# Patient Record
Sex: Male | Born: 1975 | Hispanic: Yes | Marital: Single | Smoking: Never smoker
Health system: Southern US, Community
[De-identification: ages and names within clinical notes are randomized; demographics above are authoritative.]

## PROBLEM LIST (undated history)

## (undated) HISTORY — PX: NASAL FRACTURE SURGERY: SHX718

---

## 2015-12-24 ENCOUNTER — Emergency Department (HOSPITAL_COMMUNITY)
Admission: EM | Admit: 2015-12-24 | Discharge: 2015-12-24 | Disposition: A | Discharge status: Home / Self Care | Payer: Self-pay | Attending: Emergency Medicine | Admitting: Emergency Medicine

## 2015-12-24 ENCOUNTER — Encounter (HOSPITAL_COMMUNITY): Payer: Self-pay

## 2015-12-24 DIAGNOSIS — L299 Pruritus, unspecified: Secondary | ICD-10-CM | POA: Insufficient documentation

## 2015-12-24 MED ORDER — HYDROXYZINE HCL 25 MG PO TABS: 25.0000 mg | ORAL_TABLET | Freq: Four times a day (QID) | ORAL | 0 refills | Status: AC | PRN

## 2015-12-24 MED ORDER — PREDNISONE 10 MG (21) PO TBPK: 10.0000 mg | ORAL_TABLET | Freq: Every day | ORAL | 0 refills | Status: AC

## 2015-12-24 NOTE — Discharge Instructions (Signed)
You have been seen today for itching on the skin. Take the prednisone until finished. Also begin using a lotion such as Eucerin to avoid and treat dry skin. Use the hydroxyzine as needed at night to help with itching. Use caution with the hydroxyzine as this medication can make you sleepy. It should not be used while driving or performing other dangerous activities. Follow up with a primary care provider should symptoms continue.  Spencer Bauer has sido visto por picazn en la piel. Tome la prednisona hasta que termine. Tambin comience a usar Nurse, children'suna locin como Eucerin para Automotive engineerevitar y Tax inspectortratar la piel seca. Use la hidroxicina segn sea necesario por la noche para ayudar con la picazn. Tenga precaucin con la hidroxicina ya que este medicamento puede causarle sueo. No debe usarse mientras conduce o realiza otras actividades peligrosas. Haga un seguimiento con un proveedor de atencin primaria si los sntomas continan.

## 2015-12-24 NOTE — ED Triage Notes (Signed)
Patient c/o rash and itching torso and legs x 3 weeks. Patient states he brought cream and oral medication yesterday with no relief. Patient does not know the name of the medicines.

## 2015-12-24 NOTE — ED Provider Notes (Signed)
[facility]-EMERGENCY DEPT Provider Note   CSN: [id number] Arrival date & time: 12/24/15  1519   By signing my name below, I, Bobbie StackChristopher Reid, attest that this documentation has been prepared under the direction and in the presence of Cyd Hostler, PA-C. Electronically Signed: Bobbie Stackhristopher Reid, Scribe. 12/24/15. 4:06 PM. History   Chief Complaint Chief Complaint  Patient presents with  . Rash  . Pruritis     The history is provided by the patient. A language interpreter was used ([place]).   HPI Comments: Spencer Bauer is a 40 y.o. male who presents to the Emergency Department complaining of an intermittent, unchanging, pruritic rash on his torso, extremities, and groin that started 3 weeks ago.  He states the itchiness lasts for a few minutes at a time. He denies change in soaps, detergents, or body care products. He denies extensive outdoors exposure or change in work conditions. He denies sick contacts with similar symptoms. He reports no known allergies. 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 He denies cough or fever.   History reviewed. No pertinent past medical history.  There are no active problems to display for this patient.   Past Surgical History:  Procedure Laterality Date  . NASAL FRACTURE SURGERY         Home Medications    Prior to Admission medications   Medication Sig Start Date End Date Taking? Authorizing Provider  hydrOXYzine (ATARAX/VISTARIL) 25 MG tablet Take 1 tablet (25 mg total) by mouth every 6 (six) hours as needed for itching. 12/24/15   Torina Ey C Damir Leung, PA-C  predniSONE (STERAPRED UNI-PAK 21 TAB) 10 MG (21) TBPK tablet Take 1 tablet (10 mg total) by mouth daily. Take 6 tabs by mouth daily  for 2 days, then 5 tabs for 2 days, then 4 tabs for 2 days, then 3 tabs for 2 days, 2 tabs for 2 days, then 1 tab by mouth daily for 2 days 12/24/15   Anselm PancoastShawn C Brittain Smithey, PA-C    Family History History reviewed. No pertinent family history.  Social History Social  History  Substance Use Topics  . Smoking status: Never Smoker  . Smokeless tobacco: Never Used  . Alcohol use Yes     Comment: occasionally     Allergies   Patient has no known allergies.   Review of Systems Review of Systems  Constitutional: Negative for fever.  Respiratory: Negative for cough.   Skin: Positive for rash. Negative for wound.     Physical Exam Updated Vital Signs BP 116/79 (BP Location: Left Arm)   Pulse 69   Temp 98.3 F (36.8 C) (Oral)   Resp 18   Ht 5\' 6"  (1.676 m)   Wt 164 lb 4.8 oz (74.5 kg)   SpO2 99%   BMI 26.52 kg/m   Physical Exam  Constitutional: He appears well-developed and well-nourished. No distress.  HENT:  Head: Normocephalic and atraumatic.  Eyes: Conjunctivae are normal.  Neck: Neck supple.  Cardiovascular: Normal rate and regular rhythm.   Pulmonary/Chest: Effort normal.  Neurological: He is alert.  Skin: Skin is warm and dry. He is not diaphoretic. There is erythema.  Scattered erythema across back, chest, and arms with occasional flaky skin noted. No raised lesions or pustules.   Psychiatric: He has a normal mood and affect. His behavior is normal.  Nursing note and vitals reviewed.    ED Treatments / Results  DIAGNOSTIC STUDIES: Oxygen Saturation is 99% on RA, adequate by my interpretation.    COORDINATION  OF CARE: 4:07 PM Discussed treatment plan with pt at bedside and pt agreed to plan.  Labs (all labs ordered are listed, but only abnormal results are displayed) Labs Reviewed - No data to display  EKG  EKG Interpretation None       Radiology No results found.  Procedures Procedures (including critical care time)  Medications Ordered in ED Medications - No data to display   Initial Impression / Assessment and Plan / ED Course  I have reviewed the triage vital signs and the nursing notes.  Pertinent labs & imaging results that were available during my care of the patient were reviewed by me and  considered in my medical decision making (see chart for details).  Clinical Course     Patient presents with intermittent pruritic rash over the last 3 weeks. Physical exam findings are consistent with xeroderma vs contact dermatitis. Recommended PCP follow-up.     Final Clinical Impressions(s) / ED Diagnoses   Final diagnoses:  Pruritus    New Prescriptions Discharge Medication List as of 12/24/2015  4:23 PM    START taking these medications   Details  hydrOXYzine (ATARAX/VISTARIL) 25 MG tablet Take 1 tablet (25 mg total) by mouth every 6 (six) hours as needed for itching., Starting Tue 12/24/2015, Print    predniSONE (STERAPRED UNI-PAK 21 TAB) 10 MG (21) TBPK tablet Take 1 tablet (10 mg total) by mouth daily. Take 6 tabs by mouth daily  for 2 days, then 5 tabs for 2 days, then 4 tabs for 2 days, then 3 tabs for 2 days, 2 tabs for 2 days, then 1 tab by mouth daily for 2 days, Starting Tue 12/24/2015, Print       I personally performed the services described in this documentation, which was scribed in my presence. The recorded information has been reviewed and is accurate.    Anselm PancoastShawn C Bird Swetz, PA-C 12/24/15 1941    Geoffery Lyonsouglas Delo, MD 12/25/15 [phone]

## 2016-10-06 ENCOUNTER — Emergency Department (HOSPITAL_COMMUNITY)
Admission: EM | Admit: 2016-10-06 | Discharge: 2016-10-06 | Disposition: A | Discharge status: Home / Self Care | Payer: Self-pay | Attending: Emergency Medicine | Admitting: Emergency Medicine

## 2016-10-06 ENCOUNTER — Encounter (HOSPITAL_COMMUNITY): Payer: Self-pay | Admitting: Emergency Medicine

## 2016-10-06 DIAGNOSIS — F1010 Alcohol abuse, uncomplicated: Secondary | ICD-10-CM

## 2016-10-06 DIAGNOSIS — F1020 Alcohol dependence, uncomplicated: Secondary | ICD-10-CM | POA: Insufficient documentation

## 2016-10-06 LAB — CBC WITH DIFFERENTIAL/PLATELET
Basophils Absolute: 0 10*3/uL (ref 0.0–0.1)
Basophils Relative: 0 %
EOS PCT: 1 %
Eosinophils Absolute: 0.1 10*3/uL (ref 0.0–0.7)
HEMATOCRIT: 42.2 % (ref 39.0–52.0)
Hemoglobin: 14.2 g/dL (ref 13.0–17.0)
LYMPHS ABS: 2.5 10*3/uL (ref 0.7–4.0)
Lymphocytes Relative: 36 %
MCH: 27.6 pg (ref 26.0–34.0)
MCHC: 33.6 g/dL (ref 30.0–36.0)
MCV: 82.1 fL (ref 78.0–100.0)
MONO ABS: 0.2 10*3/uL (ref 0.1–1.0)
Monocytes Relative: 3 %
Neutro Abs: 4.2 10*3/uL (ref 1.7–7.7)
Neutrophils Relative %: 60 %
PLATELETS: 225 10*3/uL (ref 150–400)
RBC: 5.14 MIL/uL (ref 4.22–5.81)
RDW: 14 % (ref 11.5–15.5)
WBC: 7 10*3/uL (ref 4.0–10.5)

## 2016-10-06 LAB — COMPREHENSIVE METABOLIC PANEL
ALBUMIN: 3.8 g/dL (ref 3.5–5.0)
ALK PHOS: 106 U/L (ref 38–126)
ALT: 19 U/L (ref 17–63)
AST: 24 U/L (ref 15–41)
Anion gap: 10 (ref 5–15)
BUN: 10 mg/dL (ref 6–20)
CALCIUM: 8.7 mg/dL — AB (ref 8.9–10.3)
CO2: 24 mmol/L (ref 22–32)
CREATININE: 0.67 mg/dL (ref 0.61–1.24)
Chloride: 104 mmol/L (ref 101–111)
GFR calc Af Amer: >60 mL/min (ref >60)
GFR calc non Af Amer: >60 mL/min (ref >60)
GLUCOSE: 128 mg/dL — AB (ref 65–99)
Potassium: 3.4 mmol/L — ABNORMAL LOW (ref 3.5–5.1)
SODIUM: 138 mmol/L (ref 135–145)
Total Bilirubin: 0.5 mg/dL (ref 0.3–1.2)
Total Protein: 6.6 g/dL (ref 6.5–8.1)

## 2016-10-06 LAB — ETHANOL: Alcohol, Ethyl (B): <10 mg/dL (ref <10)

## 2016-10-06 LAB — LIPASE, BLOOD: Lipase: 24 U/L (ref 11–51)

## 2016-10-06 MED ORDER — SODIUM CHLORIDE 0.9 % IV BOLUS (SEPSIS)
1000.0000 mL | Freq: Once | INTRAVENOUS | Status: AC
  Administered 2016-10-06: 1000 mL via INTRAVENOUS

## 2016-10-06 MED ORDER — ONDANSETRON HCL 4 MG/2ML IJ SOLN
4.0000 mg | Freq: Once | INTRAMUSCULAR | Status: AC
  Administered 2016-10-06: 4 mg via INTRAVENOUS
  Filled 2016-10-06: qty 2

## 2016-10-06 NOTE — ED Provider Notes (Signed)
[facility]-EMERGENCY DEPT Provider Note   CSN: [id number] Arrival date & time: 10/06/16  9562     History   Chief Complaint Chief Complaint  Patient presents with  . Alcohol Intoxication    HPI Spencer Bauer is a 41 y.o. male.  HPI Patient presents due to feeling poorly. Patient states that he was in his usual state of health until yesterday. After drinking some beer in substantialamounts 2 days prior and last night, he has been feeling poorly, with generalized discomfort, nausea, vomiting, weakness. No focal pain, no confusion, and fever. Patient notes that he had not had alcohol in several months, but previously drank regularly.     History reviewed. No pertinent past medical history.  There are no active problems to display for this patient.   Past Surgical History:  Procedure Laterality Date  . NASAL FRACTURE SURGERY         Home Medications    Prior to Admission medications   Medication Sig Start Date End Date Taking? Authorizing Provider  hydrOXYzine (ATARAX/VISTARIL) 25 MG tablet Take 1 tablet (25 mg total) by mouth every 6 (six) hours as needed for itching. 12/24/15   Joy, Shawn C, PA-C  predniSONE (STERAPRED UNI-PAK 21 TAB) 10 MG (21) TBPK tablet Take 1 tablet (10 mg total) by mouth daily. Take 6 tabs by mouth daily  for 2 days, then 5 tabs for 2 days, then 4 tabs for 2 days, then 3 tabs for 2 days, 2 tabs for 2 days, then 1 tab by mouth daily for 2 days 12/24/15   Anselm Pancoast, PA-C    Family History No family history on file.  Social History Social History  Substance Use Topics  . Smoking status: Never Smoker  . Smokeless tobacco: Never Used  . Alcohol use Yes     Comment: occasionally     Allergies   Patient has no known allergies.   Review of Systems Review of Systems  Constitutional:       Per HPI, otherwise negative  HENT:       Per HPI, otherwise negative  Respiratory:       Per HPI, otherwise negative  Cardiovascular:       Per  HPI, otherwise negative  Gastrointestinal: Positive for nausea and vomiting.  Endocrine:       Negative aside from HPI  Genitourinary:       Neg aside from HPI   Musculoskeletal:       Per HPI, otherwise negative  Skin: Negative.   Neurological: Negative for syncope.     Physical Exam Updated Vital Signs BP 120/84 (BP Location: Right Arm)   Pulse 75   Temp 97.6 F (36.4 C) (Oral)   Resp 16   Ht 5' 2.99" (1.6 m)   Wt 72.6 kg (160 lb)   SpO2 93%   BMI 28.35 kg/m   Physical Exam  Constitutional: He is oriented to person, place, and time. He appears well-developed. No distress.  HENT:  Head: Normocephalic and atraumatic.  Eyes: Conjunctivae and EOM are normal.  Cardiovascular: Normal rate and regular rhythm.   Pulmonary/Chest: Effort normal. No stridor. No respiratory distress.  Abdominal: He exhibits no distension. There is no tenderness.  Musculoskeletal: He exhibits no edema.  Neurological: He is alert and oriented to person, place, and time.  Skin: Skin is warm and dry.  Psychiatric: He has a normal mood and affect.  Nursing note and vitals reviewed.    ED Treatments / Results  Labs (  all labs ordered are listed, but only abnormal results are displayed) Labs Reviewed  COMPREHENSIVE METABOLIC PANEL - Abnormal; Notable for the following:       Result Value   Potassium 3.4 (*)    Glucose, Bld 128 (*)    Calcium 8.7 (*)    All other components within normal limits  ETHANOL  LIPASE, BLOOD  CBC WITH DIFFERENTIAL/PLATELET    Procedures Procedures (including critical care time)  Medications Ordered in ED Medications  sodium chloride 0.9 % bolus 1,000 mL (0 mLs Intravenous Stopped 10/06/16 1028)  ondansetron (ZOFRAN) injection 4 mg (4 mg Intravenous Given 10/06/16 0820)  sodium chloride 0.9 % bolus 1,000 mL (1,000 mLs Intravenous New Bag/Given 10/06/16 1032)     Initial Impression / Assessment and Plan / ED Course  I have reviewed the triage vital signs and the  nursing notes.  Pertinent labs & imaging results that were available during my care of the patient were reviewed by me and considered in my medical decision making (see chart for details).  10:33 AM On repeat exam the patient is awake, alert, states that he feels substantially better. No new complains. I discussed options for counseling for alcohol abuse, and the patient was amenable to receiving resources. Absent evidence for complicated withdrawal, other complaints, and was improved symptoms, patient discharged in stable condition.  Final Clinical Impressions(s) / ED Diagnoses  Alcohol dependency   Gerhard Munch, MD 10/06/16 1034

## 2016-10-06 NOTE — ED Triage Notes (Signed)
Pt reports dinkingETOH since Sunday, states, "I am throwing up a lot, and I feel really weak." Pt denies other drug use, denies falls, trauma. Resp e/u at this time.

## 2016-10-06 NOTE — ED Notes (Signed)
States he drank to much alcohol Sunday and Monday and now he doesn't feel well. C/o abd. pain

## 2017-03-23 ENCOUNTER — Emergency Department (HOSPITAL_COMMUNITY)
Admission: EM | Admit: 2017-03-23 | Discharge: 2017-03-23 | Disposition: A | Discharge status: Home / Self Care | Payer: Self-pay | Attending: Emergency Medicine | Admitting: Emergency Medicine

## 2017-03-23 ENCOUNTER — Encounter (HOSPITAL_COMMUNITY): Payer: Self-pay | Admitting: Radiology

## 2017-03-23 ENCOUNTER — Emergency Department (HOSPITAL_COMMUNITY): Payer: Self-pay

## 2017-03-23 DIAGNOSIS — F10920 Alcohol use, unspecified with intoxication, uncomplicated: Secondary | ICD-10-CM | POA: Insufficient documentation

## 2017-03-23 DIAGNOSIS — Z79899 Other long term (current) drug therapy: Secondary | ICD-10-CM | POA: Insufficient documentation

## 2017-03-23 DIAGNOSIS — R202 Paresthesia of skin: Secondary | ICD-10-CM | POA: Insufficient documentation

## 2017-03-23 LAB — BASIC METABOLIC PANEL
Anion gap: 10 (ref 5–15)
BUN: 13 mg/dL (ref 6–20)
CHLORIDE: 103 mmol/L (ref 101–111)
CO2: 26 mmol/L (ref 22–32)
CREATININE: 0.57 mg/dL — AB (ref 0.61–1.24)
Calcium: 9.2 mg/dL (ref 8.9–10.3)
GFR calc Af Amer: >60 mL/min (ref >60)
GFR calc non Af Amer: >60 mL/min (ref >60)
Glucose, Bld: 110 mg/dL — ABNORMAL HIGH (ref 65–99)
Potassium: 4 mmol/L (ref 3.5–5.1)
SODIUM: 139 mmol/L (ref 135–145)

## 2017-03-23 LAB — CBC WITH DIFFERENTIAL/PLATELET
Basophils Absolute: 0 10*3/uL (ref 0.0–0.1)
Basophils Relative: 0 %
EOS ABS: 0 10*3/uL (ref 0.0–0.7)
EOS PCT: 0 %
HCT: 43.8 % (ref 39.0–52.0)
Hemoglobin: 14.8 g/dL (ref 13.0–17.0)
LYMPHS ABS: 1.4 10*3/uL (ref 0.7–4.0)
Lymphocytes Relative: 20 %
MCH: 28.7 pg (ref 26.0–34.0)
MCHC: 33.8 g/dL (ref 30.0–36.0)
MCV: 84.9 fL (ref 78.0–100.0)
MONOS PCT: 5 %
Monocytes Absolute: 0.4 10*3/uL (ref 0.1–1.0)
Neutro Abs: 5.2 10*3/uL (ref 1.7–7.7)
Neutrophils Relative %: 75 %
PLATELETS: 234 10*3/uL (ref 150–400)
RBC: 5.16 MIL/uL (ref 4.22–5.81)
RDW: 14.1 % (ref 11.5–15.5)
WBC: 7 10*3/uL (ref 4.0–10.5)

## 2017-03-23 MED ORDER — SODIUM CHLORIDE 0.9 % IV BOLUS (SEPSIS)
1000.0000 mL | Freq: Once | INTRAVENOUS | Status: DC
  Administered 2017-03-23: 1000 mL via INTRAVENOUS

## 2017-03-23 NOTE — ED Notes (Signed)
Currently pts only complaint is that he has generalized weakness. He had an episode earlier that resulted in facial tingling and bilateral hand cramping and tingling. Pt denies anxiety or hyperventilation. Pt denies that this incident has ever happened before.

## 2017-03-23 NOTE — ED Notes (Signed)
ED Provider at bedside. 

## 2017-03-23 NOTE — ED Triage Notes (Signed)
Pt BIB [facility]. Pt c/o bilateral hand cramping. The pt had a burrito and beer prior to his arrival to the ED. Pt ambulatory upon arrival.

## 2017-03-23 NOTE — ED Provider Notes (Signed)
[facility]-EMERGENCY DEPT Provider Note   CSN: [id number] Arrival date & time: 03/23/17  1027     History   Chief Complaint Chief Complaint  Patient presents with  . Weakness    HPI Spencer Bauer is a 42 y.o. male who presents to ED for evaluation of an episode of bilateral hand paresthesias that occurred approximately 2 hours ago that lasted about 3-4 minutes.  States that symptoms began after he drank about 10-12 Heineken's.  He denies daily drinking and states that he drinks approximately every 2-3 months.  Also reports intermittent nausea since his symptoms began.  Denies any previous history of similar symptoms in the past.  At the time of the incident, he did not have any headache, vision changes, weakness or numbness.  He states that it felt like his hands had "fallen asleep."  Currently denies any symptoms including no vision changes, head injuries, trouble breathing, numbness in arms or legs, changes in gait, blood thinner use, vomiting, diarrhea.  HPI  History reviewed. No pertinent past medical history.  There are no active problems to display for this patient.   Past Surgical History:  Procedure Laterality Date  . NASAL FRACTURE SURGERY         Home Medications    Prior to Admission medications   Medication Sig Start Date End Date Taking? Authorizing Provider  hydrOXYzine (ATARAX/VISTARIL) 25 MG tablet Take 1 tablet (25 mg total) by mouth every 6 (six) hours as needed for itching. 12/24/15   Joy, Shawn C, PA-C  predniSONE (STERAPRED UNI-PAK 21 TAB) 10 MG (21) TBPK tablet Take 1 tablet (10 mg total) by mouth daily. Take 6 tabs by mouth daily  for 2 days, then 5 tabs for 2 days, then 4 tabs for 2 days, then 3 tabs for 2 days, 2 tabs for 2 days, then 1 tab by mouth daily for 2 days 12/24/15   Anselm Pancoast, PA-C    Family History No family history on file.  Social History Social History   Tobacco Use  . Smoking status: Never Smoker  .  Smokeless tobacco: Never Used  Substance Use Topics  . Alcohol use: Yes    Comment: occasionally  . Drug use: No     Allergies   Patient has no known allergies.   Review of Systems Review of Systems  Constitutional: Negative for appetite change, chills and fever.  HENT: Negative for ear pain, rhinorrhea, sneezing and sore throat.   Eyes: Negative for photophobia and visual disturbance.  Respiratory: Negative for cough, chest tightness, shortness of breath and wheezing.   Cardiovascular: Negative for chest pain and palpitations.  Gastrointestinal: Negative for abdominal pain, blood in stool, constipation, diarrhea, nausea and vomiting.  Genitourinary: Negative for dysuria, hematuria and urgency.  Musculoskeletal: Negative for myalgias.  Skin: Negative for rash.  Neurological: Negative for dizziness, weakness and light-headedness.       + Bilateral hand paresthesias     Physical Exam Updated Vital Signs BP 124/75 (BP Location: Left Arm)   Pulse 72   Temp 98.2 F (36.8 C) (Oral)   Resp 16   SpO2 95%   Physical Exam  Constitutional: He is oriented to person, place, and time. He appears well-developed and well-nourished. No distress.  Nontoxic appearing and in no acute distress.  Appears mildly intoxicated. No tremors noted.  HENT:  Head: Normocephalic and atraumatic.  Nose: Nose normal.  No signs of head injury.  Eyes: Conjunctivae and EOM are normal. Right  eye exhibits no discharge. Left eye exhibits no discharge. No scleral icterus.  Neck: Normal range of motion. Neck supple.  Cardiovascular: Normal rate, regular rhythm, normal heart sounds and intact distal pulses. Exam reveals no gallop and no friction rub.  No murmur heard. Pulmonary/Chest: Effort normal and breath sounds normal. No respiratory distress.  Abdominal: Soft. Bowel sounds are normal. He exhibits no distension. There is no tenderness. There is no guarding.  Musculoskeletal: Normal range of motion. He  exhibits no edema.  Neurological: He is alert and oriented to person, place, and time. No cranial nerve deficit or sensory deficit. He exhibits normal muscle tone. Coordination normal.  Pupils reactive. No facial asymmetry noted. Cranial nerves appear grossly intact. Sensation intact to light touch on face, BUE and BLE. Strength 5/5 in BUE and BLE. Normal finger to nose coordination bilaterally.  Skin: Skin is warm and dry. No rash noted.  Psychiatric: He has a normal mood and affect.  Nursing note and vitals reviewed.    ED Treatments / Results  Labs (all labs ordered are listed, but only abnormal results are displayed) Labs Reviewed  BASIC METABOLIC PANEL - Abnormal; Notable for the following components:      Result Value   Glucose, Bld 110 (*)    Creatinine, Ser 0.57 (*)    All other components within normal limits  CBC WITH DIFFERENTIAL/PLATELET    EKG  EKG Interpretation None       Radiology Ct Head Wo Contrast  Result Date: 03/23/2017 CLINICAL DATA:  Generalized weakness, some facial tingling and bilateral hand cramping and tingling EXAM: CT HEAD WITHOUT CONTRAST TECHNIQUE: Contiguous axial images were obtained from the base of the skull through the vertex without intravenous contrast. COMPARISON:  None. FINDINGS: Brain: The ventricular system is normal in size and configuration and the septum is midline in position. Benign-appearing bilateral basal ganglial calcification is present. The fourth ventricle and basilar cisterns are unremarkable. No hemorrhage, mass lesion, or acute infarction is seen. Vascular: No vascular abnormality is noted on this unenhanced study. Skull: On bone window images, no calvarial abnormality is seen. Sinuses/Orbits: No sinusitis is seen, but there is a rounded collection within the left maxillary sinus with attenuation of 21 HU most consistent with retention cyst. Other: None. IMPRESSION: 1. Negative unenhanced CT of the brain. 2. 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 Electronically Signed   By: Dwyane Dee M.D.   On: 03/23/2017 08:18    Procedures Procedures (including critical care time)  Medications Ordered in ED Medications - No data to display   Initial Impression / Assessment and Plan / ED Course  I have reviewed the triage vital signs and the nursing notes.  Pertinent labs & imaging results that were available during my care of the patient were reviewed by me and considered in my medical decision making (see chart for details).     Patient presents to ED for evaluation of one episode that lasted 3-4 minutes approximately 2 hours prior to arrival of bilateral hand paresthesias.  Symptoms began after he drank about 10-12 Heineken's.  Denies daily drinking and states that he drinks approximately every 2-3 months.  On physical exam he has no deficits on his neurological examination.  He is alert, oriented, with no facial droop or changes in speech.  He does appear mildly intoxicated.  There are no tremors noted and he does not appear to be suffering from withdrawal.  CT of the head  was unremarkable.  CBC, BMP was unremarkable as well.  No electrolyte abnormalities or neurological cause that can be explained by the imaging.  Symptoms could be due to his alcohol intoxication.  Will give information about nutrition and advised him [facility] for [facility].  I doubt stroke, intracranial hemorrhage, other neurological process as a cause of symptoms. Patient appears stable for d/c at this time. Strict return precautions given.  Portions of this note were generated with Scientist, clinical (histocompatibility and immunogenetics)Dragon dictation software. Dictation errors may occur despite best attempts at proofreading.   Final Clinical Impressions(s) / ED Diagnoses   Final diagnoses:  Alcoholic intoxication without complication [facility](HCC)    ED Discharge Orders    None       Dietrich PatesKhatri, Jaliya Siegmann, PA-C 03/23/17 [id number]    Azalia Bilisampos,  Kevin, MD 03/23/17 1122

## 2018-08-16 IMAGING — CT CT HEAD W/O CM
3 series · 14 of 46 positions shown, 16 images · non-contrast
Comparison: None.

CLINICAL DATA: Generalized weakness, some facial tingling and
bilateral hand cramping and tingling

EXAM:
CT HEAD WITHOUT CONTRAST
TECHNIQUE: Contiguous axial images were obtained from the base of the skull
through the vertex without intravenous contrast.

[Series 2: head wo · axial · 0.47mm/px · z∈[-141,-21]mm · 8 of 29 slices shown, 10 images]
[im 3/29  brain]
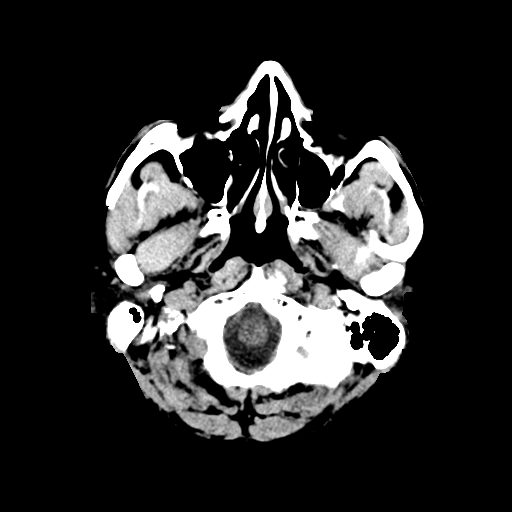
[im 3/29  bone]
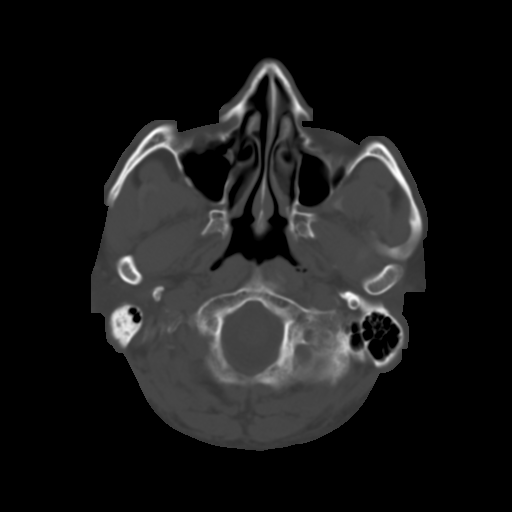
[im 7/29  brain]
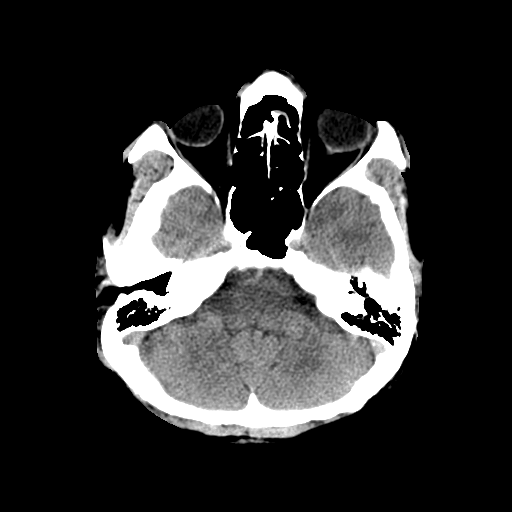
[im 10/29  brain]
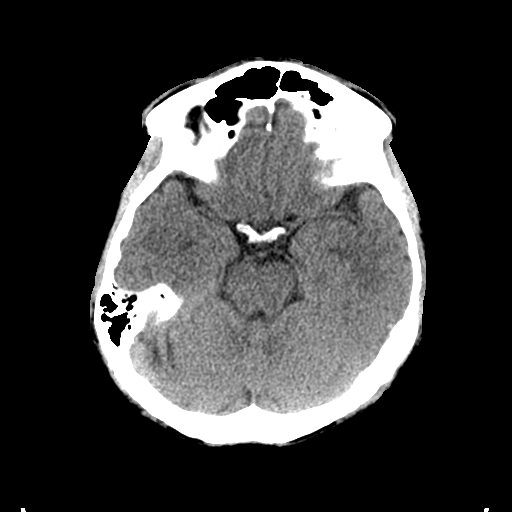
[im 13/29  brain]
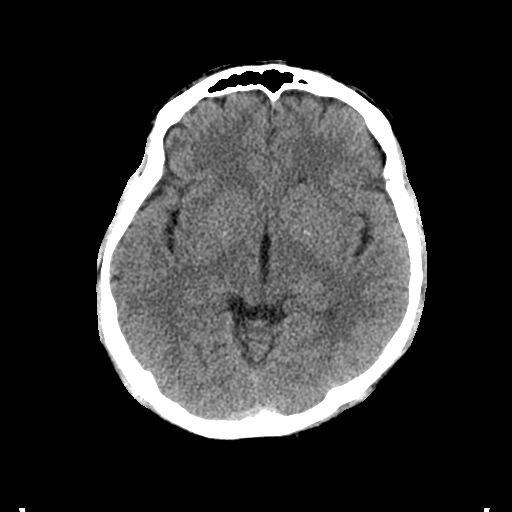
[im 17/29  brain]
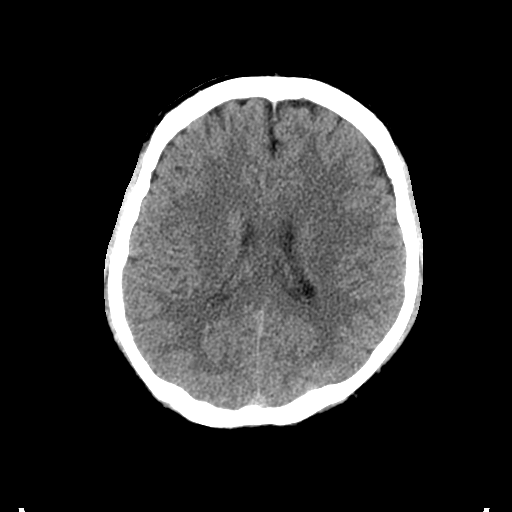
[im 17/29  bone]
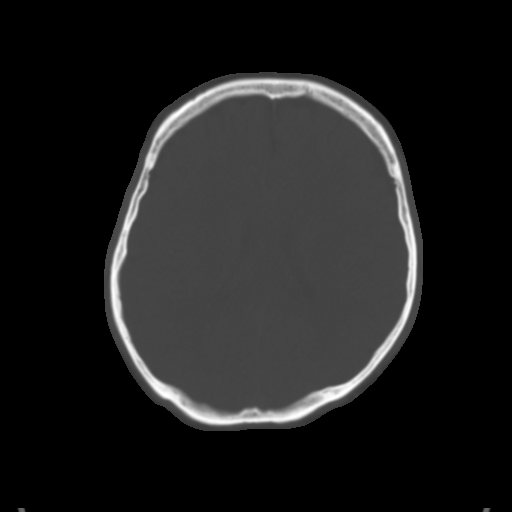
[im 20/29  brain]
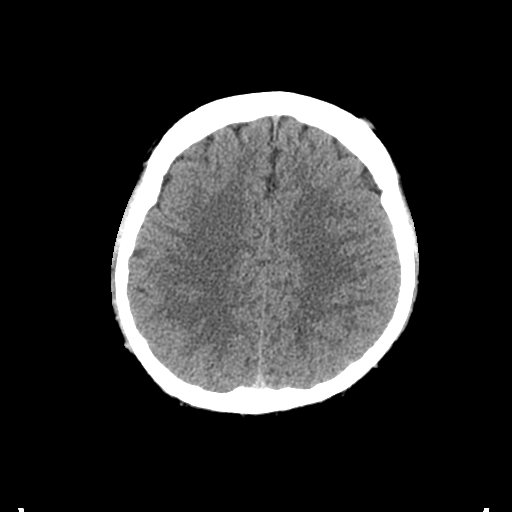
[im 23/29  brain]
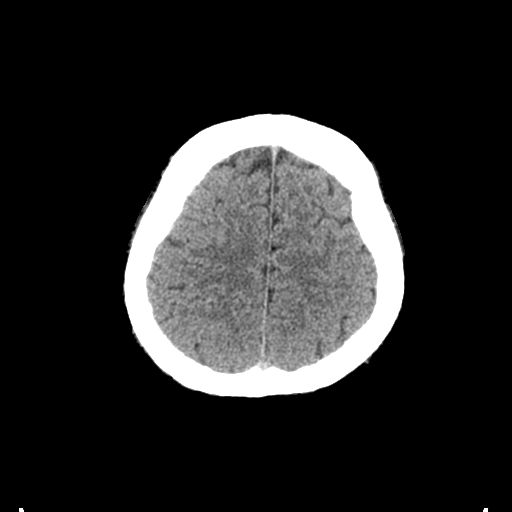
[im 27/29  brain]
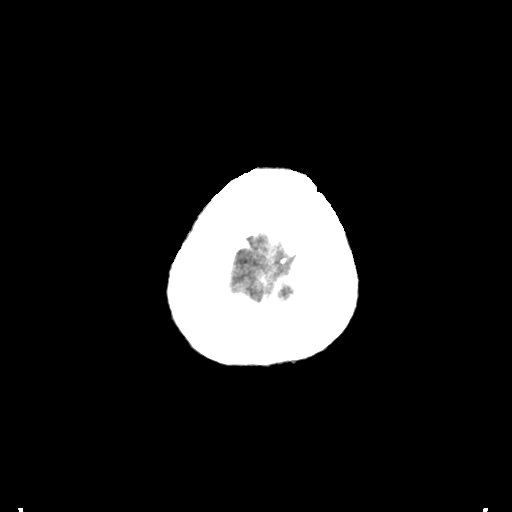

[Series 4: coronal soft tissue · coronal · 0.29mm/px · 3 of 69 slices shown]
[im 23/69  brain]
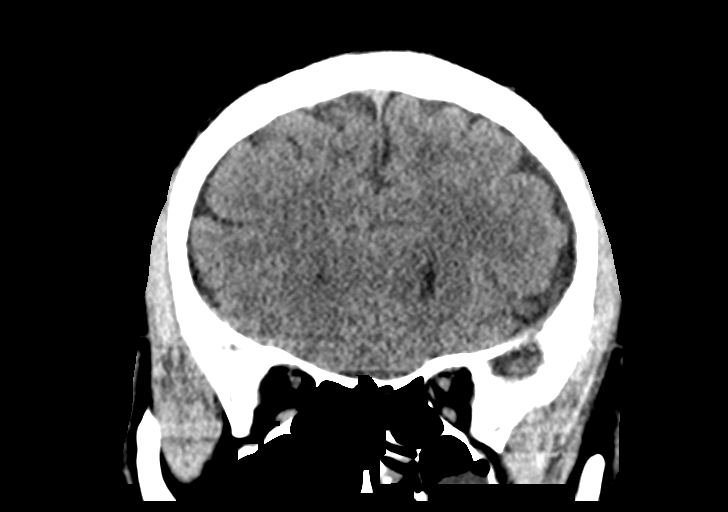
[im 31/69  brain]
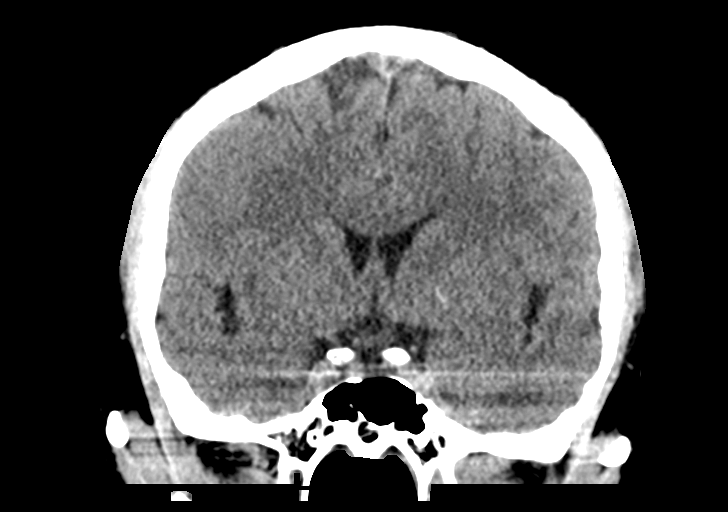
[im 38/69  brain]
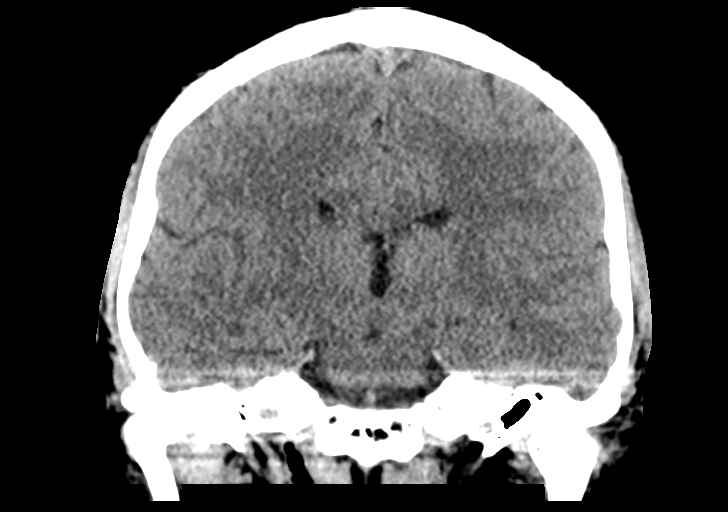

[Series 5: sagittal soft tissue · sagittal · 0.29mm/px · 3 of 62 slices shown]
[im 21/62  brain]
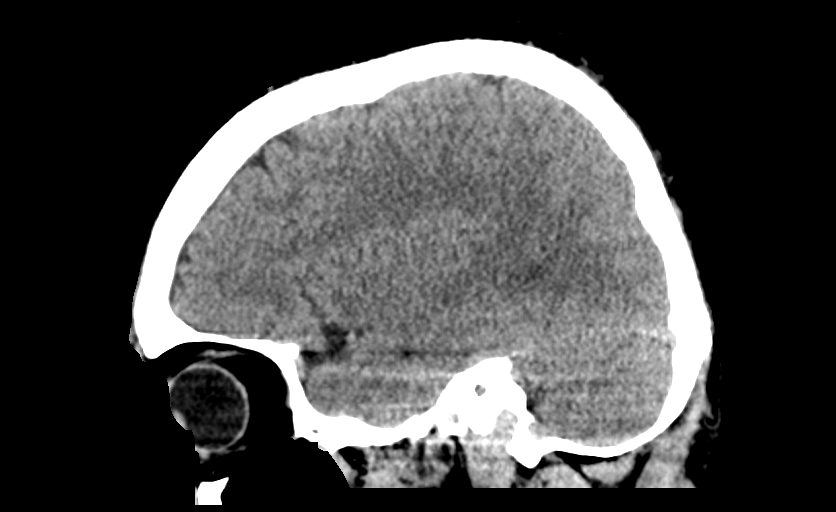
[im 31/62  brain]
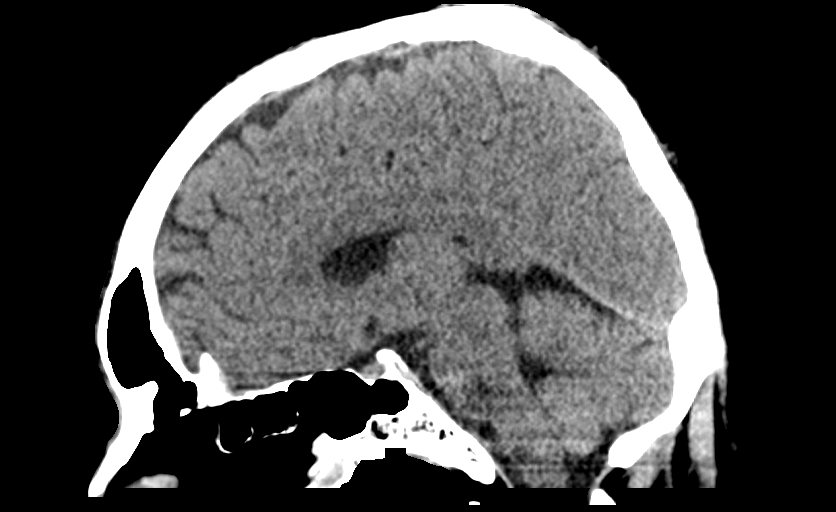
[im 41/62  brain]
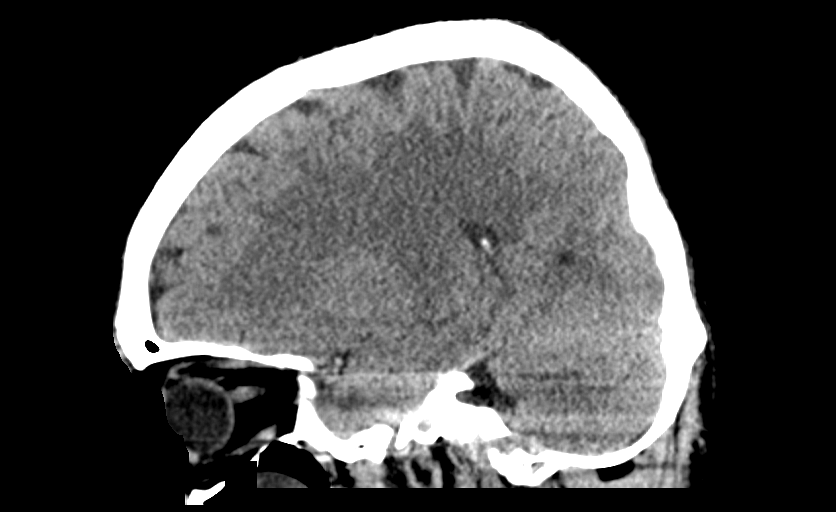

[14 of 46 positions shown; findings below may reference images not displayed]

FINDINGS: Brain: The ventricular system is normal in size and configuration
and the septum is midline in position. Benign-appearing bilateral
basal ganglial calcification is present. The fourth ventricle and
basilar cisterns are unremarkable. No hemorrhage, mass lesion, or
acute infarction is seen.

Vascular: No vascular abnormality is noted on this unenhanced study.

Skull: On bone window images, no calvarial abnormality is seen.

Sinuses/Orbits: No sinusitis is seen, but there is a rounded
collection within the left maxillary sinus with attenuation of 21 HU
most consistent with retention cyst.

Other: None.
IMPRESSION: 1. Negative unenhanced CT of the brain.
2. Probable retention cyst in the left maxillary sinus of 2.6 cm in
diameter.
